# Patient Record
Sex: Male | Born: 1985 | Race: Black or African American | Hispanic: No | Marital: Single | Smoking: Never smoker
Health system: Southern US, Community
[De-identification: ages and names within clinical notes are randomized; demographics above are authoritative.]

---

## 2015-11-15 ENCOUNTER — Emergency Department: Payer: No Typology Code available for payment source

## 2015-11-15 ENCOUNTER — Encounter: Payer: Self-pay | Admitting: Emergency Medicine

## 2015-11-15 ENCOUNTER — Emergency Department
Admission: EM | Admit: 2015-11-15 | Discharge: 2015-11-15 | Disposition: A | Discharge status: Home / Self Care | Payer: No Typology Code available for payment source | Attending: Emergency Medicine | Admitting: Emergency Medicine

## 2015-11-15 DIAGNOSIS — Y92410 Unspecified street and highway as the place of occurrence of the external cause: Secondary | ICD-10-CM | POA: Diagnosis not present

## 2015-11-15 DIAGNOSIS — Y939 Activity, unspecified: Secondary | ICD-10-CM | POA: Diagnosis not present

## 2015-11-15 DIAGNOSIS — Y999 Unspecified external cause status: Secondary | ICD-10-CM | POA: Insufficient documentation

## 2015-11-15 DIAGNOSIS — S161XXA Strain of muscle, fascia and tendon at neck level, initial encounter: Secondary | ICD-10-CM | POA: Diagnosis not present

## 2015-11-15 DIAGNOSIS — S3992XA Unspecified injury of lower back, initial encounter: Secondary | ICD-10-CM | POA: Diagnosis present

## 2015-11-15 DIAGNOSIS — S39012A Strain of muscle, fascia and tendon of lower back, initial encounter: Secondary | ICD-10-CM | POA: Insufficient documentation

## 2015-11-15 DIAGNOSIS — R51 Headache: Secondary | ICD-10-CM | POA: Insufficient documentation

## 2015-11-15 MED ORDER — IBUPROFEN 800 MG PO TABS
800.0000 mg | ORAL_TABLET | Freq: Once | ORAL | Status: AC
  Administered 2015-11-15: 800 mg via ORAL
  Filled 2015-11-15: qty 1

## 2015-11-15 MED ORDER — HYDROCODONE-ACETAMINOPHEN 5-325 MG PO TABS: 1.0000 | ORAL_TABLET | Freq: Four times a day (QID) | ORAL | Status: AC | PRN

## 2015-11-15 MED ORDER — IBUPROFEN 800 MG PO TABS: 800.0000 mg | ORAL_TABLET | Freq: Three times a day (TID) | ORAL | Status: AC | PRN

## 2015-11-15 MED ORDER — HYDROCODONE-ACETAMINOPHEN 5-325 MG PO TABS
1.0000 | ORAL_TABLET | Freq: Once | ORAL | Status: AC
  Administered 2015-11-15: 1 via ORAL
  Filled 2015-11-15: qty 1

## 2015-11-15 NOTE — ED Notes (Signed)
c collar removed per MD instructions.   
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
## 2015-11-15 NOTE — ED Notes (Signed)
Pt to CT via stretcher accomp by CT tech 

## 2015-11-15 NOTE — Discharge Instructions (Signed)
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 knees and/or an excessive swayback. WALKING Walk with an upright posture. Your ears, shoulders and hips should all line-up. PROLONGED ACTIVITY IN A FLEXED POSITION When completing a task that requires you to bend forward at your waist or lean over a  low surface, try to find a way to stabilize 3 out of 4 of your limbs. You can place a hand or elbow on your thigh or rest a knee on the surface you are reaching across. This will provide you more stability, so that your muscles do not tire as quickly. By keeping your knees relaxed, or slightly bent, you will also reduce stress across your lower back. CORRECT LIFTING TECHNIQUES DO :  Assume a wide stance. This will provide you more stability and the opportunity to get as close as possible to the object which you are lifting.  Tense your abdominals to brace your spine. Bend at the knees and hips. Keeping your back locked in a neutral-spine position, lift using your leg muscles. Lift  with your legs, keeping your back straight.  Test the weight of unknown objects before attempting to lift them.  Try to keep your elbows locked down at your sides in order get the best strength from your shoulders when carrying an object.  Always ask for help when lifting heavy or awkward objects. INCORRECT LIFTING TECHNIQUES DO NOT:   Lock your knees when lifting, even if it is a small object.  Bend and twist. Pivot at your feet or move your feet when needing to change directions.  Assume that you can safely pick up even a paperclip without proper posture.   This information is not intended to replace advice given to you by your health care provider. Make sure you discuss any questions you have with your health care provider.   Document Released: 05/20/2005 Document Revised: 06/10/2014 Document Reviewed: 09/01/2008 [employer] Interactive Patient Education [employer]

## 2015-11-15 NOTE — ED Notes (Signed)
Pt returns to room via stretcher, no distress noted
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
